# Patient Record
Sex: Female | Born: 1980 | Race: White | Hispanic: No | Marital: Married | Smoking: Never smoker
Health system: Southern US, Community
[De-identification: ages and names within clinical notes are randomized; demographics above are authoritative.]

## PROBLEM LIST (undated history)

## (undated) DIAGNOSIS — O0940 Supervision of pregnancy with grand multiparity, unspecified trimester: Secondary | ICD-10-CM

## (undated) DIAGNOSIS — IMO0001 Reserved for inherently not codable concepts without codable children: Secondary | ICD-10-CM

## (undated) HISTORY — PX: DILATION AND CURETTAGE OF UTERUS: SHX78

## (undated) HISTORY — PX: OVARIAN CYST SURGERY: SHX726

---

## 2006-04-30 ENCOUNTER — Inpatient Hospital Stay (HOSPITAL_COMMUNITY): Admission: AD | Admit: 2006-04-30 | Discharge: 2006-05-01 | Payer: Self-pay | Admitting: Obstetrics and Gynecology

## 2008-01-20 ENCOUNTER — Inpatient Hospital Stay (HOSPITAL_COMMUNITY): Admission: AD | Admit: 2008-01-20 | Discharge: 2008-01-20 | Payer: Self-pay | Admitting: Obstetrics and Gynecology

## 2008-03-22 ENCOUNTER — Inpatient Hospital Stay (HOSPITAL_COMMUNITY): Admission: AD | Admit: 2008-03-22 | Discharge: 2008-03-23 | Payer: Self-pay | Admitting: Obstetrics and Gynecology

## 2008-12-11 ENCOUNTER — Encounter: Admission: RE | Admit: 2008-12-11 | Discharge: 2008-12-11 | Payer: Self-pay | Admitting: Family Medicine

## 2010-04-11 ENCOUNTER — Inpatient Hospital Stay (HOSPITAL_COMMUNITY): Admission: AD | Admit: 2010-04-11 | Discharge: 2010-04-12 | Payer: Self-pay | Admitting: Obstetrics and Gynecology

## 2010-08-26 ENCOUNTER — Encounter: Admission: RE | Admit: 2010-08-26 | Discharge: 2010-08-26 | Payer: Self-pay | Admitting: Family Medicine

## 2010-12-21 LAB — CBC
HCT: 31.8 % — ABNORMAL LOW (ref 36.0–46.0)
Hemoglobin: 12.5 g/dL (ref 12.0–15.0)
MCH: 29.1 pg (ref 26.0–34.0)
MCHC: 33.7 g/dL (ref 30.0–36.0)
MCHC: 34.9 g/dL (ref 30.0–36.0)
MCV: 85.5 fL (ref 78.0–100.0)
Platelets: 149 10*3/uL — ABNORMAL LOW (ref 150–400)
RDW: 16.1 % — ABNORMAL HIGH (ref 11.5–15.5)
RDW: 16.2 % — ABNORMAL HIGH (ref 11.5–15.5)
WBC: 11.1 10*3/uL — ABNORMAL HIGH (ref 4.0–10.5)

## 2010-12-21 LAB — RPR: RPR Ser Ql: NONREACTIVE

## 2011-02-20 NOTE — H&P (Signed)
Jacqueline Tanner, Jacqueline Tanner            ACCOUNT NO.:  [id number]   MEDICAL RECORD NO.:  [id number]          PATIENT TYPE:  INP   LOCATION:  9138                          FACILITY:  [facility]   PHYSICIAN:  Lenoard Aden, M.D.DATE OF BIRTH:  June 19, 1981   DATE OF ADMISSION:  04/30/2006  DATE OF DISCHARGE:                                HISTORY & PHYSICAL   CHIEF COMPLAINT:  Labor.   HISTORY OF PRESENT ILLNESS:  The patient is a 30 year old white female G5,  P2-0-2-2 who presents at [redacted] weeks gestation in labor.  She has a history of  uncomplicated vaginal delivery x3, D and C x2.   ALLERGIES:  PENICILLIN, SHELLFISH, SURGICAL TAPE.   SOCIAL HISTORY:  She is a nonsmoker and nondrinker.  She denies domestic or  physical violence.   PAST MEDICAL HISTORY:  No history of chronic hypertension, diabetes,  migraines, ovarian cancer, depression.   OBSTETRICAL HISTORY:  Her prenatal course was uncomplicated.   PHYSICAL EXAMINATION:  GENERAL APPEARANCE:  She is a well-developed, well-  nourished white female in a moderate amount of discomfort.  HEENT:  Normal.  NECK:  Supple.  LUNGS:  Clear.  HEART:  Regular rate and rhythm.  ABDOMEN:  Soft, gravid, nontender.  PELVIC:  Cervix 5 cm, 100%, minus 1.  EXTREMITIES:  Reveal no cords.  NEUROLOGICAL:  Nonfocal.   IMPRESSION:  Term intrauterine pregnancy in active labor.   PLAN:  Anticipate attempts at vaginal delivery.      Lenoard Aden, M.D.  Electronically Signed     RJT/MEDQ  D:  05/01/2006  T:  05/01/2006  Job:  [id number]

## 2011-02-20 NOTE — Op Note (Signed)
Jacqueline Tanner, ROTTMANN            ACCOUNT NO.:  [id number]   MEDICAL RECORD NO.:  [id number]          PATIENT TYPE:  INP   LOCATION:                                FACILITY:  [facility]   PHYSICIAN:  Lenoard Aden, M.D.DATE OF BIRTH:  08-Sep-1981   DATE OF PROCEDURE:  DATE OF DISCHARGE:  05/01/2006                                 OPERATIVE REPORT   BRIEF DESCRIPTION OF DELIVERY:  The patient is coping well with labor, fetal  heart tones within normal limits, contraction are regular.  The patient is  fully dilated, LOA, and plus 3 station.  She pushes three times after  spontaneous amniotomy of clear fluid for delivery of a full term living  female from occiput anterior position over a second degree midline  laceration.  The fetus received Apgars of 8 and 9.  Cord blood collected.  Placenta delivered spontaneously intact, three vessel cord noted.  No  cervical or vaginal lacerations noted.  Repair of second degree midline  perineal laceration with 3-0 Vicryl repeat without complications.  Estimated  blood loss 500 mL.  No complications.  Mother and baby doing well and  tolerated the procedure without difficulty.      Lenoard Aden, M.D.  Electronically Signed     RJT/MEDQ  D:  05/01/2006  T:  05/01/2006  Job:  [id number]

## 2011-07-02 LAB — CCBB MATERNAL DONOR DRAW

## 2011-07-02 LAB — CBC
Hemoglobin: 14.1
MCHC: 34.9
Platelets: 159
RBC: 4.47
RDW: 13
RDW: 13.4
WBC: 11.5 — ABNORMAL HIGH

## 2011-07-02 LAB — RPR: RPR Ser Ql: NONREACTIVE

## 2011-09-14 LAB — OB RESULTS CONSOLE HIV ANTIBODY (ROUTINE TESTING): HIV: NONREACTIVE

## 2011-09-14 LAB — OB RESULTS CONSOLE ANTIBODY SCREEN: Antibody Screen: NEGATIVE

## 2011-09-14 LAB — OB RESULTS CONSOLE ABO/RH: RH Type: POSITIVE

## 2011-09-14 LAB — OB RESULTS CONSOLE RUBELLA ANTIBODY, IGM: Rubella: IMMUNE

## 2011-10-06 NOTE — L&D Delivery Note (Addendum)
Delivery Note At 7:55 PM a viable female was delivered via Vaginal, Spontaneous Delivery (Presentation: Right Occiput Posterior).  APGAR: 8, 9; weight pending.   Placenta status: Intact, Spontaneous.  Cord: 3 vessels with the following complications: true Knot.  Cord pH: not done  Anesthesia: Epidural  Episiotomy: None Lacerations: 1st degree Suture Repair: 3.0 vicryl rapide Est. Blood Loss (mL): 300  Mom to postpartum.  Baby to mother for kangaroo care  IV and epidural cath maintained for BTL in AM.  PAUL,DANIELA 04/09/2012, 9:08 PM

## 2012-04-03 ENCOUNTER — Inpatient Hospital Stay (HOSPITAL_COMMUNITY)
Admission: AD | Admit: 2012-04-03 | Discharge: 2012-04-04 | Disposition: A | Discharge status: Home / Self Care | Payer: BC Managed Care – PPO | Source: Ambulatory Visit | Attending: Obstetrics and Gynecology | Admitting: Obstetrics and Gynecology

## 2012-04-03 ENCOUNTER — Encounter (HOSPITAL_COMMUNITY): Payer: Self-pay | Admitting: *Deleted

## 2012-04-03 DIAGNOSIS — O479 False labor, unspecified: Secondary | ICD-10-CM | POA: Insufficient documentation

## 2012-04-03 NOTE — MAU Note (Signed)
Pt G9 P5, at 38.3wks having contractions since 1700.  Denies bleeding, leaking or problems with pregnancy.

## 2012-04-04 NOTE — MAU Provider Note (Signed)
  History     CSN: [id number]  Arrival date and time: 04/03/12 2308 Call to provider @0025  Provider here to examine patient @ 0045     Chief Complaint  Patient presents with  . Labor Eval   HPI  Ctx irregular all day - regular x 2 hours and stronger 10-11pm No LOF No bleeding or vaginal discharge  History reviewed. No pertinent past medical history.  Past Surgical History  Procedure Date  . Ovarian cyst surgery   . Dilation and curettage of uterus     Family History  Problem Relation Age of Onset  . Heart disease Mother   . Hypertension Mother     History  Substance Use Topics  . Smoking status: Never Smoker   . Smokeless tobacco: Not on file  . Alcohol Use: No    Allergies:  Allergies  Allergen Reactions  . Penicillins Hives  . Shrimp (Shellfish Allergy) Itching    Prescriptions prior to admission  Medication Sig Dispense Refill  . acetaminophen (TYLENOL) 500 MG tablet Take 600 mg by mouth once.      . diphenhydrAMINE (BENADRYL) 25 MG tablet Take 25 mg by mouth every 6 (six) hours as needed.      . Multiple Vitamin (MULTIVITAMIN) capsule Take 1 capsule by mouth daily.        ROS Physical Exam   Blood pressure 126/77, pulse 94, temperature 98.4 F (36.9 C), temperature source Oral, resp. rate 18, height 5\' 7"  (1.702 m), weight 99.701 kg (219 lb 12.8 oz).  Physical Exam Comfortable, NAD Abdomen soft and non-tender VE: 3 / 80/ Vtx / -1  FHR category 1 - no decels CTX every 10 minutes mild  MAU Course  Procedures  - labor check  Assessment and Plan  38 4/7 braxton-hicks ctx  1) discharge home 2) labor precautions to call  Marlinda Mike 04/04/2012, 12:50 AM

## 2012-04-09 ENCOUNTER — Inpatient Hospital Stay (HOSPITAL_COMMUNITY): Payer: BC Managed Care – PPO | Admitting: Anesthesiology

## 2012-04-09 ENCOUNTER — Inpatient Hospital Stay (HOSPITAL_COMMUNITY)
Admission: AD | Admit: 2012-04-09 | Discharge: 2012-04-11 | DRG: 373 | Disposition: A | Discharge status: Home / Self Care | Payer: BC Managed Care – PPO | Source: Ambulatory Visit | Attending: Obstetrics & Gynecology | Admitting: Obstetrics & Gynecology

## 2012-04-09 ENCOUNTER — Encounter (HOSPITAL_COMMUNITY): Payer: Self-pay | Admitting: *Deleted

## 2012-04-09 ENCOUNTER — Encounter (HOSPITAL_COMMUNITY): Payer: Self-pay | Admitting: Anesthesiology

## 2012-04-09 DIAGNOSIS — IMO0001 Reserved for inherently not codable concepts without codable children: Secondary | ICD-10-CM

## 2012-04-09 HISTORY — DX: Reserved for inherently not codable concepts without codable children: IMO0001

## 2012-04-09 HISTORY — DX: Supervision of pregnancy with grand multiparity, unspecified trimester: O09.40

## 2012-04-09 LAB — RPR: RPR Ser Ql: NONREACTIVE

## 2012-04-09 LAB — CBC
MCH: 26.9 pg (ref 26.0–34.0)
MCHC: 32.5 g/dL (ref 30.0–36.0)
Platelets: 185 10*3/uL (ref 150–400)
RDW: 14 % (ref 11.5–15.5)

## 2012-04-09 LAB — ABO/RH: ABO/RH(D): O POS

## 2012-04-09 LAB — TYPE AND SCREEN
ABO/RH(D): O POS
Antibody Screen: NEGATIVE

## 2012-04-09 MED ORDER — ACETAMINOPHEN 325 MG PO TABS: 650.0000 mg | ORAL_TABLET | ORAL | Status: DC | PRN

## 2012-04-09 MED ORDER — TERBUTALINE SULFATE 1 MG/ML IJ SOLN: 0.2500 mg | Freq: Once | INTRAMUSCULAR | Status: DC | PRN

## 2012-04-09 MED ORDER — LACTATED RINGERS IV SOLN
INTRAVENOUS | Status: DC
  Administered 2012-04-10: 1000 mL via INTRAVENOUS
  Administered 2012-04-10: 06:00:00 via INTRAVENOUS

## 2012-04-09 MED ORDER — SIMETHICONE 80 MG PO CHEW
80.0000 mg | CHEWABLE_TABLET | ORAL | Status: DC | PRN
  Administered 2012-04-10 (×2): 80 mg via ORAL

## 2012-04-09 MED ORDER — LACTATED RINGERS IV SOLN
500.0000 mL | Freq: Once | INTRAVENOUS | Status: AC
  Administered 2012-04-09: 1000 mL via INTRAVENOUS

## 2012-04-09 MED ORDER — DEXTROSE 5 % IV SOLN: INTRAVENOUS | Status: DC

## 2012-04-09 MED ORDER — WITCH HAZEL-GLYCERIN EX PADS
1.0000 "application " | MEDICATED_PAD | CUTANEOUS | Status: DC | PRN
  Administered 2012-04-10: 1 via TOPICAL

## 2012-04-09 MED ORDER — CITRIC ACID-SODIUM CITRATE 334-500 MG/5ML PO SOLN: 30.0000 mL | ORAL | Status: DC | PRN

## 2012-04-09 MED ORDER — FENTANYL 2.5 MCG/ML BUPIVACAINE 1/10 % EPIDURAL INFUSION (WH - ANES)
14.0000 mL/h | INTRAMUSCULAR | Status: DC
Start: 2012-04-09 — End: 2012-04-09
  Administered 2012-04-09: 14 mL/h via EPIDURAL
  Filled 2012-04-09 (×2): qty 60

## 2012-04-09 MED ORDER — OXYCODONE-ACETAMINOPHEN 5-325 MG PO TABS: 1.0000 | ORAL_TABLET | ORAL | Status: DC | PRN

## 2012-04-09 MED ORDER — METOCLOPRAMIDE HCL 10 MG PO TABS
10.0000 mg | ORAL_TABLET | Freq: Once | ORAL | Status: AC
  Administered 2012-04-10: 10 mg via ORAL
  Filled 2012-04-09: qty 1

## 2012-04-09 MED ORDER — FAMOTIDINE 20 MG PO TABS
40.0000 mg | ORAL_TABLET | Freq: Once | ORAL | Status: AC
  Administered 2012-04-10: 40 mg via ORAL
  Filled 2012-04-09: qty 2

## 2012-04-09 MED ORDER — LANOLIN HYDROUS EX OINT: TOPICAL_OINTMENT | CUTANEOUS | Status: DC | PRN

## 2012-04-09 MED ORDER — ONDANSETRON HCL 4 MG/2ML IJ SOLN: 4.0000 mg | Freq: Four times a day (QID) | INTRAMUSCULAR | Status: DC | PRN

## 2012-04-09 MED ORDER — PRENATAL MULTIVITAMIN CH
1.0000 | ORAL_TABLET | Freq: Every day | ORAL | Status: DC
  Administered 2012-04-11: 1 via ORAL
  Filled 2012-04-09 (×2): qty 1

## 2012-04-09 MED ORDER — FENTANYL 2.5 MCG/ML BUPIVACAINE 1/10 % EPIDURAL INFUSION (WH - ANES)
INTRAMUSCULAR | Status: DC | PRN
  Administered 2012-04-09: 14 mL/h via EPIDURAL

## 2012-04-09 MED ORDER — DIPHENHYDRAMINE HCL 50 MG/ML IJ SOLN
12.5000 mg | INTRAMUSCULAR | Status: DC | PRN
Start: 2012-04-09 — End: 2012-04-09

## 2012-04-09 MED ORDER — LIDOCAINE HCL (PF) 1 % IJ SOLN
30.0000 mL | INTRAMUSCULAR | Status: DC | PRN
  Filled 2012-04-09: qty 30

## 2012-04-09 MED ORDER — LACTATED RINGERS IV SOLN
INTRAVENOUS | Status: DC
  Administered 2012-04-09: 13:00:00 via INTRAVENOUS

## 2012-04-09 MED ORDER — OXYTOCIN BOLUS FROM INFUSION
250.0000 mL | Freq: Once | INTRAVENOUS | Status: DC
  Filled 2012-04-09: qty 500

## 2012-04-09 MED ORDER — IBUPROFEN 600 MG PO TABS
600.0000 mg | ORAL_TABLET | Freq: Four times a day (QID) | ORAL | Status: DC
  Administered 2012-04-10 – 2012-04-11 (×5): 600 mg via ORAL
  Filled 2012-04-09 (×5): qty 1

## 2012-04-09 MED ORDER — OXYTOCIN 40 UNITS IN LACTATED RINGERS INFUSION - SIMPLE MED
1.0000 m[IU]/min | INTRAVENOUS | Status: DC
  Administered 2012-04-09: 2 m[IU]/min via INTRAVENOUS

## 2012-04-09 MED ORDER — OXYCODONE-ACETAMINOPHEN 5-325 MG PO TABS
1.0000 | ORAL_TABLET | ORAL | Status: DC | PRN
  Administered 2012-04-10 (×3): 1 via ORAL
  Administered 2012-04-11: 2 via ORAL
  Filled 2012-04-09: qty 1
  Filled 2012-04-09: qty 2
  Filled 2012-04-09 (×2): qty 1

## 2012-04-09 MED ORDER — DIBUCAINE 1 % RE OINT: 1.0000 "application " | TOPICAL_OINTMENT | RECTAL | Status: DC | PRN

## 2012-04-09 MED ORDER — OXYTOCIN 10 UNIT/ML IJ SOLN: 10.0000 [IU] | Freq: Once | INTRAMUSCULAR | Status: DC

## 2012-04-09 MED ORDER — ZOLPIDEM TARTRATE 5 MG PO TABS: 5.0000 mg | ORAL_TABLET | Freq: Every evening | ORAL | Status: DC | PRN

## 2012-04-09 MED ORDER — OXYTOCIN 40 UNITS IN LACTATED RINGERS INFUSION - SIMPLE MED: 62.5000 mL/h | Freq: Once | INTRAVENOUS | Status: DC

## 2012-04-09 MED ORDER — FLEET ENEMA 7-19 GM/118ML RE ENEM: 1.0000 | ENEMA | RECTAL | Status: DC | PRN

## 2012-04-09 MED ORDER — DIPHENHYDRAMINE HCL 25 MG PO CAPS: 25.0000 mg | ORAL_CAPSULE | Freq: Four times a day (QID) | ORAL | Status: DC | PRN

## 2012-04-09 MED ORDER — ONDANSETRON HCL 4 MG PO TABS: 4.0000 mg | ORAL_TABLET | ORAL | Status: DC | PRN

## 2012-04-09 MED ORDER — FLEET ENEMA 7-19 GM/118ML RE ENEM: 1.0000 | ENEMA | Freq: Every day | RECTAL | Status: DC | PRN

## 2012-04-09 MED ORDER — BENZOCAINE-MENTHOL 20-0.5 % EX AERO: 1.0000 "application " | INHALATION_SPRAY | CUTANEOUS | Status: DC | PRN

## 2012-04-09 MED ORDER — OXYTOCIN 40 UNITS IN LACTATED RINGERS INFUSION - SIMPLE MED
INTRAVENOUS | Status: AC
  Administered 2012-04-09: 2 m[IU]/min via INTRAVENOUS
  Filled 2012-04-09: qty 1000

## 2012-04-09 MED ORDER — EPHEDRINE 5 MG/ML INJ: 10.0000 mg | INTRAVENOUS | Status: DC | PRN

## 2012-04-09 MED ORDER — SODIUM BICARBONATE 8.4 % IV SOLN
INTRAVENOUS | Status: DC | PRN
  Administered 2012-04-09: 4 mL via EPIDURAL

## 2012-04-09 MED ORDER — PHENYLEPHRINE 40 MCG/ML (10ML) SYRINGE FOR IV PUSH (FOR BLOOD PRESSURE SUPPORT)
80.0000 ug | PREFILLED_SYRINGE | INTRAVENOUS | Status: DC | PRN
  Filled 2012-04-09: qty 5

## 2012-04-09 MED ORDER — PHENYLEPHRINE 40 MCG/ML (10ML) SYRINGE FOR IV PUSH (FOR BLOOD PRESSURE SUPPORT): 80.0000 ug | PREFILLED_SYRINGE | INTRAVENOUS | Status: DC | PRN

## 2012-04-09 MED ORDER — TETANUS-DIPHTH-ACELL PERTUSSIS 5-2.5-18.5 LF-MCG/0.5 IM SUSP
0.5000 mL | Freq: Once | INTRAMUSCULAR | Status: AC
  Administered 2012-04-10: 0.5 mL via INTRAMUSCULAR
  Filled 2012-04-09: qty 0.5

## 2012-04-09 MED ORDER — LACTATED RINGERS IV SOLN: 500.0000 mL | INTRAVENOUS | Status: DC | PRN

## 2012-04-09 MED ORDER — EPHEDRINE 5 MG/ML INJ
10.0000 mg | INTRAVENOUS | Status: DC | PRN
  Administered 2012-04-09: 10 mg via INTRAVENOUS
  Filled 2012-04-09: qty 4

## 2012-04-09 MED ORDER — SENNOSIDES-DOCUSATE SODIUM 8.6-50 MG PO TABS
2.0000 | ORAL_TABLET | Freq: Every day | ORAL | Status: DC
  Administered 2012-04-10: 2 via ORAL

## 2012-04-09 MED ORDER — BISACODYL 10 MG RE SUPP: 10.0000 mg | Freq: Every day | RECTAL | Status: DC | PRN

## 2012-04-09 MED ORDER — ONDANSETRON HCL 4 MG/2ML IJ SOLN: 4.0000 mg | INTRAMUSCULAR | Status: DC | PRN

## 2012-04-09 MED ORDER — IBUPROFEN 600 MG PO TABS: 600.0000 mg | ORAL_TABLET | Freq: Four times a day (QID) | ORAL | Status: DC | PRN

## 2012-04-09 NOTE — Anesthesia Procedure Notes (Signed)
Epidural Patient location during procedure: OB  Preanesthetic Checklist Completed: patient identified, site marked, surgical consent, pre-op evaluation, timeout performed, IV checked, risks and benefits discussed and monitors and equipment checked  Epidural Patient position: sitting Prep: site prepped and draped and DuraPrep Patient monitoring: continuous pulse ox and blood pressure Approach: midline Injection technique: LOR air  Needle:  Needle type: Tuohy  Needle gauge: 17 G Needle length: 9 cm Needle insertion depth: 5 cm cm Catheter type: closed end flexible Catheter size: 19 Gauge Catheter at skin depth: 11 cm Test dose: negative  Assessment Events: blood not aspirated, injection not painful, no injection resistance, negative IV test and no paresthesia  Additional Notes Dosing of Epidural:  1st dose, through needle ............................................. epi 1:200K + Xylocaine 40 mg  2nd dose, through catheter, after waiting 3 minutes.....epi 1:200K + Xylocaine 40 mg  3rd dose, through catheter after waiting 3 minutes .............................Marcaine   4mg   ( mg Marcaine are expressed as equivilent  cc's medication removed from the 0.1%Bupiv / fentanyl syringe from L&D pump)  ( 2% Xylo charted as a single dose in Epic Meds for ease of charting; actual dosing was fractionated as above, for saftey's sake)  As each dose occurred, patient was free of IV sx; and patient exhibited no evidence of SA injection.  Patient is more comfortable after epidural dosed. Please see RN's note for documentation of vital signs,and FHR which are stable.  Patient reminded not to try to ambulate with numb legs, and that an RN must be present the 1st time she attempts to get up.    

## 2012-04-09 NOTE — Progress Notes (Signed)
Jacqueline Tanner is a 31 y.o. [phone] at [redacted]w[redacted]d by LMP admitted for labor  Subjective: S/P epidural, ctx, infrequent, Pitocin augmentation started. Comfortable.  Objective: Filed Vitals:   04/09/12 1530 04/09/12 1600 04/09/12 1630 04/09/12 1702  BP: 101/49 99/50 99/53  98/56  Pulse: 88 84 81 85  Temp:  98.2 F (36.8 C)    TempSrc:  Oral    Resp: 18 18 18 18   Height:      Weight:      SpO2:              FHT:  FHR: 145 bpm, variability: moderate,  accelerations:  Present,  decelerations:  Absent UC:   Irregular, mild/mod SVE:   Dilation: 5 Effacement (%): 80 Station: -1 Exam by:: Colon Flattery, CNM AROM, cl AF, IUPC placed Labs:   Basename 04/09/12 1240  WBC 10.6*  HGB 11.2*  HCT 34.5*  PLT 185    Assessment / Plan: Protracted active phase  Labor: AROM and Pitocin for augmentation, IUPC placed Preeclampsia:  n/a Fetal Wellbeing:  Category I Pain Control:  Epidural I/D:  GBS neg Anticipated MOD:  NSVD  Jacqueline Tanner 04/09/2012, 5:52 PM

## 2012-04-09 NOTE — H&P (Signed)
Jacqueline Tanner is a 31 y.o. female at [redacted]w[redacted]d presenting for painful ctx since early AM. Q 5 min for past hour. S/P membrane strip in office yesterday, was 4/80/-2. Denies, N/V, LOF / VB. Good fetal movement.  Maternal Medical History:  Reason for admission: Reason for Admission:   nausea  OB History    Grav Para Term Preterm Abortions TAB SAB Ect Mult Living   9 5 5  3  3   5      No past medical history on file. Past Surgical History  Procedure Date  . Ovarian cyst surgery   . Dilation and curettage of uterus    Family History: family history includes Heart disease in her mother and Hypertension in her mother. Social History:  reports that she has never smoked. She does not have any smokeless tobacco history on file. She reports that she does not drink alcohol or use illicit drugs.   Prenatal Transfer Tool  Maternal Diabetes: No Genetic Screening: Declined Maternal Ultrasounds/Referrals: Normal Fetal Ultrasounds or other Referrals:  None Maternal Substance Abuse:  No Significant Maternal Medications:  None Significant Maternal Lab Results:  Lab values include: Group B Strep negative Other Comments:  None  Review of Systems  Eyes: Negative for blurred vision.  Gastrointestinal: Negative for nausea and vomiting.  Neurological: Negative for headaches.      Blood pressure 120/71, pulse 92, temperature 98.3 F (36.8 C), temperature source Oral, resp. rate 18, height 5\' 7"  (1.702 m), weight 98.884 kg (218 lb). Maternal Exam:  Uterine Assessment: Contraction strength is moderate.  Contraction duration is 60 seconds. Contraction frequency is regular.   Abdomen: Patient reports no abdominal tenderness. Fundal height is S=D.   Estimated fetal weight is 8.5.   Fetal presentation: vertex  Introitus: Normal vulva. Pelvis: adequate for delivery.   Proven to 9'1 Cervix: Cervix evaluated by digital exam.   5/90/-2  Fetal Exam Fetal Monitor Review: Mode: ultrasound.   Baseline  rate: 140.  Variability: moderate (6-25 bpm).   Pattern: accelerations present and no decelerations.    Fetal State Assessment: Category I - tracings are normal.     Physical Exam  Constitutional: She is oriented to person, place, and time. She appears well-developed and well-nourished.  HENT:  Head: Normocephalic.  Eyes: Pupils are equal, round, and reactive to light.  Neck: Normal range of motion.  Cardiovascular: Normal rate and regular rhythm.   Respiratory: Effort normal and breath sounds normal.  GI: Bowel sounds are normal.  Genitourinary: Vagina normal.  Musculoskeletal: Normal range of motion. She exhibits no edema.  Neurological: She is alert and oriented to person, place, and time.  Skin: Skin is warm.  Psychiatric: She has a normal mood and affect.    Prenatal labs: ABO, Rh: O/Positive/-- (12/10 0000) Antibody: Negative (12/10 0000) Rubella: Immune (12/10 0000) RPR: Nonreactive (12/10 0000)  HBsAg: Negative (12/10 0000)  HIV: Non-reactive (12/10 0000)  GBS: Negative (06/14 0000)  Genetic screen; declined  Assessment/Plan: IUP at term, latent labor Reassuring fetal / maternal status GBS neg Undesired fertility, desires PPTL and has been counseled in office  Admit to [facility] Routine orders Epidural PRN AROM and Pitocin PRN   PAUL,DANIELA 04/09/2012 01:30 PM    PAUL,DANIELA 04/09/2012, 1:04 PM

## 2012-04-09 NOTE — Anesthesia Preprocedure Evaluation (Signed)

## 2012-04-09 NOTE — Progress Notes (Signed)
Patient ID: Jacqueline Tanner, female   DOB: 1981/04/23, 31 y.o.   MRN: [id number] Patient seen in LDR. CNM service patient. Uncomplicated pregnancy, G6P5, at term, anticipate SVD. Desires permanent sterilization. Was counseled in office.  CREST data on permanent female sterilization incl risk of regret and risk of failure reviewed. Postpartum tubal ligation procedure reviewed, including risk of bleeding, infection, damage to internal organs, risk of ectopic pregnancy in case of failure reviewed, patient voiced understanding.  Plan to schedule tomorrow morning, will keep epidural after birth.  --V.Juliene Pina, MD

## 2012-04-09 NOTE — H&P (Addendum)
Pt reviewed, agree with note and plan. --V.Juliene Pina, MD  MD note- Updated H&P 04/10/12. Healthy 31 yo, G6P6, postpartum here for PPTL. No med/surg hx. PCN allergy. SocHx married, non smoker. VS stable, exam nl, no interval change. A/P reviewed, PPTL desired, consents with informed risks/benefits/ alternative.  --V.Juliene Pina, MD

## 2012-04-10 ENCOUNTER — Encounter (HOSPITAL_COMMUNITY): Payer: Self-pay

## 2012-04-10 ENCOUNTER — Encounter (HOSPITAL_COMMUNITY)
Admission: AD | Disposition: A | Discharge status: Home / Self Care | Payer: Self-pay | Source: Ambulatory Visit | Attending: Obstetrics & Gynecology

## 2012-04-10 ENCOUNTER — Inpatient Hospital Stay (HOSPITAL_COMMUNITY): Payer: BC Managed Care – PPO

## 2012-04-10 ENCOUNTER — Encounter (HOSPITAL_COMMUNITY): Payer: Self-pay | Admitting: *Deleted

## 2012-04-10 HISTORY — PX: TUBAL LIGATION: SHX77

## 2012-04-10 LAB — CBC
HCT: 31.9 % — ABNORMAL LOW (ref 36.0–46.0)
MCH: 27.8 pg (ref 26.0–34.0)
MCHC: 33.2 g/dL (ref 30.0–36.0)
MCV: 83.7 fL (ref 78.0–100.0)
RDW: 14.5 % (ref 11.5–15.5)

## 2012-04-10 LAB — SURGICAL PCR SCREEN: Staphylococcus aureus: NEGATIVE

## 2012-04-10 SURGERY — LIGATION, FALLOPIAN TUBE, POSTPARTUM
Anesthesia: Epidural | Site: Abdomen | Laterality: Bilateral | Wound class: Clean Contaminated

## 2012-04-10 MED ORDER — DEXAMETHASONE SODIUM PHOSPHATE 10 MG/ML IJ SOLN
INTRAMUSCULAR | Status: AC
  Filled 2012-04-10: qty 1

## 2012-04-10 MED ORDER — FENTANYL CITRATE 0.05 MG/ML IJ SOLN
INTRAMUSCULAR | Status: AC
  Filled 2012-04-10: qty 2

## 2012-04-10 MED ORDER — BUPIVACAINE HCL (PF) 0.25 % IJ SOLN
INTRAMUSCULAR | Status: AC
  Filled 2012-04-10: qty 30

## 2012-04-10 MED ORDER — DEXAMETHASONE SODIUM PHOSPHATE 10 MG/ML IJ SOLN
INTRAMUSCULAR | Status: DC | PRN
  Administered 2012-04-10: 10 mg via INTRAVENOUS

## 2012-04-10 MED ORDER — LIDOCAINE-EPINEPHRINE (PF) 2 %-1:200000 IJ SOLN
INTRAMUSCULAR | Status: AC
  Filled 2012-04-10: qty 20

## 2012-04-10 MED ORDER — ONDANSETRON HCL 4 MG/2ML IJ SOLN
INTRAMUSCULAR | Status: DC | PRN
  Administered 2012-04-10: 4 mg via INTRAVENOUS

## 2012-04-10 MED ORDER — PROPOFOL 10 MG/ML IV EMUL
INTRAVENOUS | Status: AC
  Filled 2012-04-10: qty 20

## 2012-04-10 MED ORDER — LACTATED RINGERS IV SOLN
INTRAVENOUS | Status: DC | PRN
  Administered 2012-04-10 (×2): via INTRAVENOUS

## 2012-04-10 MED ORDER — SODIUM BICARBONATE 8.4 % IV SOLN
INTRAVENOUS | Status: DC | PRN
  Administered 2012-04-10: 5 mL via EPIDURAL
  Administered 2012-04-10: 10 mL via EPIDURAL

## 2012-04-10 MED ORDER — CHLOROPROCAINE HCL 3 % IJ SOLN
INTRAMUSCULAR | Status: AC
  Filled 2012-04-10: qty 40

## 2012-04-10 MED ORDER — SODIUM CHLORIDE 0.9 % IR SOLN
Status: DC | PRN
  Administered 2012-04-10: 1000 mL

## 2012-04-10 MED ORDER — HYDROMORPHONE HCL PF 1 MG/ML IJ SOLN
INTRAMUSCULAR | Status: AC
  Filled 2012-04-10: qty 1

## 2012-04-10 MED ORDER — SODIUM BICARBONATE 8.4 % IV SOLN
INTRAVENOUS | Status: AC
  Filled 2012-04-10: qty 50

## 2012-04-10 MED ORDER — BUPIVACAINE HCL (PF) 0.25 % IJ SOLN
INTRAMUSCULAR | Status: DC | PRN
  Administered 2012-04-10: 10 mL

## 2012-04-10 MED ORDER — PROPOFOL 10 MG/ML IV BOLUS
INTRAVENOUS | Status: DC | PRN
  Administered 2012-04-10: 5 mg via INTRAVENOUS
  Administered 2012-04-10: 10 mg via INTRAVENOUS
  Administered 2012-04-10: 5 mg via INTRAVENOUS
  Administered 2012-04-10: 10 mg via INTRAVENOUS

## 2012-04-10 MED ORDER — MIDAZOLAM HCL 5 MG/5ML IJ SOLN
INTRAMUSCULAR | Status: DC | PRN
  Administered 2012-04-10 (×2): 1 mg via INTRAVENOUS

## 2012-04-10 MED ORDER — LIDOCAINE HCL (CARDIAC) 20 MG/ML IV SOLN
INTRAVENOUS | Status: AC
  Filled 2012-04-10: qty 5

## 2012-04-10 MED ORDER — MIDAZOLAM HCL 2 MG/2ML IJ SOLN
INTRAMUSCULAR | Status: AC
  Filled 2012-04-10: qty 2

## 2012-04-10 MED ORDER — HYDROMORPHONE HCL PF 1 MG/ML IJ SOLN
0.2500 mg | INTRAMUSCULAR | Status: DC | PRN
  Administered 2012-04-10 (×2): 0.5 mg via INTRAVENOUS

## 2012-04-10 MED ORDER — SODIUM BICARBONATE 8.4 % IV SOLN
INTRAVENOUS | Status: DC | PRN
  Administered 2012-04-10: 5 mL via EPIDURAL
  Administered 2012-04-10: 3 mL via EPIDURAL
  Administered 2012-04-10: 5 mL via EPIDURAL

## 2012-04-10 MED ORDER — FENTANYL CITRATE 0.05 MG/ML IJ SOLN
INTRAMUSCULAR | Status: DC | PRN
  Administered 2012-04-10 (×2): 50 ug via INTRAVENOUS

## 2012-04-10 MED ORDER — ONDANSETRON HCL 4 MG/2ML IJ SOLN
INTRAMUSCULAR | Status: AC
  Filled 2012-04-10: qty 2

## 2012-04-10 SURGICAL SUPPLY — 29 items
ADH SKN CLS LQ APL DERMABOND (GAUZE/BANDAGES/DRESSINGS) ×1
CHLORAPREP W/TINT 26ML (MISCELLANEOUS) ×2 IMPLANT
CLOTH BEACON ORANGE TIMEOUT ST (SAFETY) ×2 IMPLANT
CONTAINER PREFILL 10% NBF 15ML (MISCELLANEOUS) ×4 IMPLANT
DERMABOND ADHESIVE PROPEN (GAUZE/BANDAGES/DRESSINGS) ×1
DERMABOND ADVANCED .7 DNX6 (GAUZE/BANDAGES/DRESSINGS) IMPLANT
ELECT REM PT RETURN 9FT ADLT (ELECTROSURGICAL) ×2
ELECTRODE REM PT RTRN 9FT ADLT (ELECTROSURGICAL) ×1 IMPLANT
GLOVE BIO SURGEON STRL SZ7 (GLOVE) ×2 IMPLANT
GLOVE BIOGEL PI IND STRL 7.0 (GLOVE) ×2 IMPLANT
GLOVE BIOGEL PI INDICATOR 7.0 (GLOVE) ×2
GLOVE SKINSENSE NS SZ6.5 (GLOVE) ×2
GLOVE SKINSENSE STRL SZ6.5 (GLOVE) IMPLANT
GOWN PREVENTION PLUS LG XLONG (DISPOSABLE) ×4 IMPLANT
NDL HYPO 25X1 1.5 SAFETY (NEEDLE) ×1 IMPLANT
NEEDLE HYPO 25X1 1.5 SAFETY (NEEDLE) ×2 IMPLANT
NS IRRIG 1000ML POUR BTL (IV SOLUTION) ×2 IMPLANT
PACK ABDOMINAL MINOR (CUSTOM PROCEDURE TRAY) ×2 IMPLANT
PENCIL BUTTON HOLSTER BLD 10FT (ELECTRODE) ×2 IMPLANT
SPONGE LAP 4X18 X RAY DECT (DISPOSABLE) ×1 IMPLANT
STRIP CLOSURE SKIN 1/4X3 (GAUZE/BANDAGES/DRESSINGS) ×2 IMPLANT
SUT PLAIN 0 NONE (SUTURE) ×2 IMPLANT
SUT VIC AB 0 CT1 27 (SUTURE) ×2
SUT VIC AB 0 CT1 27XBRD ANBCTR (SUTURE) ×1 IMPLANT
SUT VICRYL 4-0 PS2 18IN ABS (SUTURE) ×2 IMPLANT
SYR CONTROL 10ML LL (SYRINGE) ×2 IMPLANT
TOWEL OR 17X24 6PK STRL BLUE (TOWEL DISPOSABLE) ×4 IMPLANT
TRAY FOLEY CATH 14FR (SET/KITS/TRAYS/PACK) ×2 IMPLANT
WATER STERILE IRR 1000ML POUR (IV SOLUTION) ×1 IMPLANT

## 2012-04-10 NOTE — Op Note (Signed)
Preoperative diagnosis: Multiparity, permanent sterilization desired Postoperative diagnosis: Same Procedure: Postpartum bilateral tubal ligation by Modified Pomeroy method Surgeon: Dr Shea Evans, MD Assistants: Durwin Nora Anesthesia: Epidural  IV fluids: LR 1100 cc  EBL: minimal 10 cc Urine (foley): clear 100 cc  Complications: none Disposition: PACU and home Specimens: Cut segments of both fallopian tubes   Procedure Patient is a 31 yo, G6 P6, postpartum day 1 who desired permanent sterilization via tubal ligation. All options of contraception and sterilization were reviewed including IUDs, Essure, as well as vasectomy. Patient declined other options. Risk and complications of surgery including infection, bleeding, damage to internal organs, other complications including pneumonia, VTE were reviewed. Also discussed irreversibility as well as failure and risk of ectopic pregnancy. Patient voiced understanding. Informed written consent was obtained and patient was brought to the operating room with IV running. Timeout was carried out.  Patient was in supine position and epidural anesthesia was noted to be adequate. Uterus fundus palpated at the umbilicus. Patient was prepped and draped in standard fashion. Foley placed by circulating RN.  A  1 cm gently curved transverse incision was made in the lower fold of the umbilicus after injecting 3 cc 0.25% Marcaine. Incision was carried down to the fascia, umbilical hernia noted. Hernia sac was empty, peritoneal entry made through a clear area. Peritoneal entry was confirmed. Uterine fundus palpated. Left fallopian tube was grasped with Tanja Port and traced to fimbria. Modified Pomeroy method used for tubal ligation. Mid section of tube grasped and loop created with 2 Plain Gut ties. Loop was excised, hemostasis excellent, ties noted to be secured and was returned to the peritoneal cavity, cut segment sent to pathology. Now the right fallopian tube  was grasped with Tanja Port and traced to fimbria. Modified Pomeroy method used for tubal ligation. Mid section of tube grasped and loop created with 2 Plain Gut ties. Loop was excised, hemostasis excellent, ties noted to be secured and was returned to the peritoneal cavity, cut segment sent to pathology.  No bleeding noted. Peritoneal edges including hernia sac and the rectus fascia were grasped and sutured in one layer using 0-Vicryl. Skin approximated with 3-0 Vicryl in subcuticular fashion. Remaining 7 cc Marcaine injected at the incision site. Dermabond was applied at the incision. No complications. All counts were correct x2. Patient was brought out to the recovery room in stable condition.   --V.Juliene Pina, MD

## 2012-04-10 NOTE — Transfer of Care (Signed)
Immediate Anesthesia Transfer of Care Note  Patient: Jacqueline Tanner  Procedure(s) Performed: Procedure(s) (LRB): POST PARTUM TUBAL LIGATION (Bilateral)  Patient Location: PACU  Anesthesia Type: Epidural  Level of Consciousness: awake, alert  and oriented  Airway & Oxygen Therapy: Patient Spontanous Breathing  Post-op Assessment: Report given to PACU RN and Post -op Vital signs reviewed and stable  Post vital signs: Reviewed and stable  Complications: No apparent anesthesia complications

## 2012-04-10 NOTE — Anesthesia Postprocedure Evaluation (Signed)
  Anesthesia Post-op Note  Patient: Jacqueline Tanner  Procedure(s) Performed: Procedure(s) (LRB): POST PARTUM TUBAL LIGATION (Bilateral)  Patient Location: PACU  Anesthesia Type: Epidural  Level of Consciousness: awake, alert  and oriented  Airway and Oxygen Therapy: Patient Spontanous Breathing  Post-op Pain: mild  Post-op Assessment: Patient's Cardiovascular Status Stable, Respiratory Function Stable, Patent Airway, No signs of Nausea or vomiting and Pain level controlled  Post-op Vital Signs: stable  Complications: No apparent anesthesia complications

## 2012-04-10 NOTE — Anesthesia Postprocedure Evaluation (Signed)
  Anesthesia Post-op Note  Patient: Jacqueline Tanner  Procedure(s) Performed: Procedure(s) (LRB): POST PARTUM TUBAL LIGATION (Bilateral)  Patient Location: PACU and Mother/Baby  Anesthesia Type: Epidural  Level of Consciousness: awake, alert  and oriented  Airway and Oxygen Therapy: Patient Spontanous Breathing    Post-op Assessment: Patient's Cardiovascular Status Stable and Respiratory Function Stable  Post-op Vital Signs: stable  Complications: No apparent anesthesia complications

## 2012-04-10 NOTE — Anesthesia Preprocedure Evaluation (Addendum)

## 2012-04-10 NOTE — Anesthesia Postprocedure Evaluation (Signed)
  Anesthesia Post-op Note  Patient: Jacqueline Tanner  Procedure(s) Performed: Procedure(s) (LRB): POST PARTUM TUBAL LIGATION (Bilateral)  Patient is awake, responsive, moving her legs, and has signs of resolution of her numbness. Pain and nausea are reasonably well controlled. Vital signs are stable and clinically acceptable. Oxygen saturation is clinically acceptable. There are no apparent anesthetic complications at this time. Patient is ready for discharge.

## 2012-04-10 NOTE — Addendum Note (Signed)
Addendum  created 04/10/12 2053 by Len Blalock, CRNA   Modules edited:Notes Section

## 2012-04-11 ENCOUNTER — Encounter (HOSPITAL_COMMUNITY): Payer: Self-pay | Admitting: Obstetrics & Gynecology

## 2012-04-11 MED ORDER — OXYCODONE-ACETAMINOPHEN 5-325 MG PO TABS: 1.0000 | ORAL_TABLET | ORAL | Status: AC | PRN

## 2012-04-11 MED ORDER — IBUPROFEN 600 MG PO TABS: 600.0000 mg | ORAL_TABLET | Freq: Four times a day (QID) | ORAL | Status: AC

## 2012-04-11 NOTE — Progress Notes (Signed)
Post Partum Day #2            Information for the patient's newborn:  Shayma, Pfefferle Girl Dezaree [[id number]]  female   Feeding: breast  Subjective: No HA, SOB, CP, F/C, breast symptoms. Pain controlled w/ Motrin and occasional Percocet. Normal vaginal bleeding, no clots.    S/P PPTL yesterday.   Objective:  Temp:  [97.6 F (36.4 C)-98.5 F (36.9 C)] 97.6 F (36.4 C) (07/08 0330) Pulse Rate:  [61-75] 66  (07/08 0330) Resp:  [15-21] 18  (07/08 0330) BP: (97-127)/(63-76) 106/63 mmHg (07/08 0330) SpO2:  [93 %-97 %] 96 % (07/08 0330)   Intake/Output Summary (Last 24 hours) at 04/11/12 0943 Last data filed at 04/10/12 1015  Gross per 24 hour  Intake    400 ml  Output    400 ml  Net      0 ml       Basename 04/10/12 0544 04/09/12 1240  WBC 11.7* 10.6*  HGB 10.6* 11.2*  HCT 31.9* 34.5*  PLT 162 185    Blood type: --/--/O POS, O POS (07/06 1240) Rubella: Immune (12/10 0000)    Physical Exam:  General: alert, cooperative and no distress Uterine Fundus: firm Abd: umbilical incision w/o redness/drainage, + dermabond Lochia: appropriate Perineum: repair intact, edema none DVT Evaluation: No evidence of DVT seen on physical exam. No significant calf/ankle edema.    Assessment/Plan: PPD # 2 / 31 y.o., [id number] S/P: NVP and post-op day 1, s/p PPTL   Principal Problem:  *Postpartum care following vaginal delivery (7/6, F)    normal postpartum exam   normal post-operative exam  Continue current postpartum care  D/C home   LOS: 2 days   Shirlee Whitmire, CNM, MSN 04/11/2012, 9:43 AM

## 2012-04-11 NOTE — Discharge Summary (Signed)
Obstetric Discharge Summary Reason for Admission: onset of labor Prenatal Procedures: ultrasound Intrapartum Procedures: spontaneous vaginal delivery and epidural Postpartum Procedures: P.P. tubal ligation and TDaP vaccine Complications-Operative and Postpartum: 1st degree perineal laceration Hemoglobin  Date Value Range Status  04/10/2012 10.6* 12.0 - 15.0 g/dL Final     HCT  Date Value Range Status  04/10/2012 31.9* 36.0 - 46.0 % Final    Physical Exam:  General: alert, cooperative and no distress Lochia: appropriate Uterine Fundus: firm Incision: healing well, no significant drainage, no dehiscence, no significant erythema DVT Evaluation: No evidence of DVT seen on physical exam. No significant calf/ankle edema.  Discharge Diagnoses: Term Pregnancy-delivered and bilateral tubal ligation  Discharge Information: Date: 04/11/2012 Activity: pelvic rest Diet: routine Medications: PNV, Ibuprofen and Percocet Condition: stable Instructions: refer to practice specific booklet Discharge to: home Follow-up Information    Follow up with Marlinda Mike, CNM. Schedule an appointment as soon as possible for a visit in 6 weeks.   Contact information:   [address] [place] [address] [phone]          Newborn Data: Live born female "Natalia Leatherwood" Birth Weight: 8 lb 12 oz (3970 g) APGAR: 8, 9  Home with mother.  Jacqueline Tanner 04/11/2012, 9:48 AM

## 2012-04-13 ENCOUNTER — Inpatient Hospital Stay (HOSPITAL_COMMUNITY): Payer: BC Managed Care – PPO

## 2012-04-14 NOTE — Discharge Summary (Signed)
Agree with note and plan.

## 2014-08-06 ENCOUNTER — Encounter (HOSPITAL_COMMUNITY): Payer: Self-pay | Admitting: Obstetrics & Gynecology

## 2014-10-25 ENCOUNTER — Emergency Department (HOSPITAL_COMMUNITY): Payer: BLUE CROSS/BLUE SHIELD

## 2014-10-25 ENCOUNTER — Encounter (HOSPITAL_COMMUNITY): Payer: Self-pay | Admitting: Emergency Medicine

## 2014-10-25 ENCOUNTER — Emergency Department (HOSPITAL_COMMUNITY)
Admission: EM | Admit: 2014-10-25 | Discharge: 2014-10-25 | Disposition: A | Discharge status: Home / Self Care | Payer: BLUE CROSS/BLUE SHIELD | Attending: Emergency Medicine | Admitting: Emergency Medicine

## 2014-10-25 DIAGNOSIS — Z880 Allergy status to penicillin: Secondary | ICD-10-CM | POA: Diagnosis not present

## 2014-10-25 DIAGNOSIS — Y9289 Other specified places as the place of occurrence of the external cause: Secondary | ICD-10-CM | POA: Diagnosis not present

## 2014-10-25 DIAGNOSIS — X58XXXA Exposure to other specified factors, initial encounter: Secondary | ICD-10-CM | POA: Diagnosis not present

## 2014-10-25 DIAGNOSIS — Y998 Other external cause status: Secondary | ICD-10-CM | POA: Insufficient documentation

## 2014-10-25 DIAGNOSIS — Z79899 Other long term (current) drug therapy: Secondary | ICD-10-CM | POA: Diagnosis not present

## 2014-10-25 DIAGNOSIS — S93401A Sprain of unspecified ligament of right ankle, initial encounter: Secondary | ICD-10-CM | POA: Insufficient documentation

## 2014-10-25 DIAGNOSIS — S99911A Unspecified injury of right ankle, initial encounter: Secondary | ICD-10-CM

## 2014-10-25 DIAGNOSIS — S99921A Unspecified injury of right foot, initial encounter: Secondary | ICD-10-CM | POA: Diagnosis not present

## 2014-10-25 DIAGNOSIS — Y9344 Activity, trampolining: Secondary | ICD-10-CM | POA: Insufficient documentation

## 2014-10-25 MED ORDER — IBUPROFEN 800 MG PO TABS: 800.0000 mg | ORAL_TABLET | Freq: Three times a day (TID) | ORAL | Status: DC

## 2014-10-25 NOTE — ED Provider Notes (Signed)
CSN: [id number]     Arrival date & time 10/25/14  2041 History   None   This chart was scribed for non-physician practitioner, Elpidio AnisShari Edmundo Tedesco, working with Gwyneth SproutWhitney Plunkett, MD by Marica OtterNusrat Rahman, ED Scribe. This patient was seen in room WTR9/WTR9 and the patient's care was started at 9:13 PM.  Chief Complaint  Patient presents with  . Foot Injury   The history is provided by the patient. No language interpreter was used.   PCP: No primary care provider on file. HPI Comments: Jacqueline Tanner is a 34 y.o. female, with medical Hx noted below, who presents to the Emergency Department complaining of sudden onset, traumatic, constant right foot pain onset approximately 1.5 hours ago after jumping on a trampoline and feeling a "pop" in the foot. Pt reports it is painful to ambulate. Pt rates her pain a 6 out of 10.   Past Medical History  Diagnosis Date  . Active labor 04/09/2012  . Grand multipara in labor 04/09/2012  . Postpartum care following vaginal delivery (6/6, F) 04/09/2012   Past Surgical History  Procedure Laterality Date  . Ovarian cyst surgery    . Dilation and curettage of uterus    . Tubal ligation  04/10/2012    Procedure: POST PARTUM TUBAL LIGATION;  Surgeon: Robley FriesVaishali R Mody, MD;  Location: [facility];  Service: Gynecology;  Laterality: Bilateral;  Bilateral post partum tubal ligation   Family History  Problem Relation Age of Onset  . Heart disease Mother   . Hypertension Mother    History  Substance Use Topics  . Smoking status: Never Smoker   . Smokeless tobacco: Not on file  . Alcohol Use: No   OB History    Gravida Para Term Preterm AB TAB SAB Ectopic Multiple Living   9 6 6  3  3   6      Review of Systems  Constitutional: Negative for fever and chills.  Musculoskeletal:       Right foot pain   Psychiatric/Behavioral: Negative for confusion.      Allergies  Penicillins and Shrimp  Home Medications   Prior to Admission medications   Medication Sig Start Date  End Date Taking? Authorizing Provider  diphenhydrAMINE (BENADRYL) 25 MG tablet Take 25 mg by mouth every 6 (six) hours as needed. For cold symptoms    Historical Provider, MD  Multiple Vitamin (MULTIVITAMIN) capsule Take 1 capsule by mouth daily.    Historical Provider, MD   Triage Vitals: BP 120/77 mmHg  Pulse 79  Temp(Src) 98 F (36.7 C) (Oral)  Resp 20  Ht 5\' 7"  (1.702 m)  Wt 180 lb (81.647 kg)  BMI 28.19 kg/m2  SpO2 100%  LMP 09/30/2014 (Approximate) Physical Exam  Constitutional: She is oriented to person, place, and time. She appears well-developed and well-nourished. No distress.  HENT:  Head: Normocephalic and atraumatic.  Eyes: Conjunctivae and EOM are normal.  Cardiovascular: Normal rate.   Pulmonary/Chest: Effort normal. No respiratory distress.  Musculoskeletal: Normal range of motion.  Right foot has lateral, proximal forefoot swelling with tenderness. No bony deformities, ankle joint stable. FROM all toes. Achilles tendon intact.   Neurological: She is alert and oriented to person, place, and time.  Skin: Skin is warm and dry.  Psychiatric: She has a normal mood and affect. Her behavior is normal.  Nursing note and vitals reviewed.   ED Course  Procedures (including critical care time) DIAGNOSTIC STUDIES: Oxygen Saturation is 100% on RA, nl by my interpretation.  COORDINATION OF CARE: 9:15 PM-Discussed treatment plan which includes imaging with pt at bedside and pt agreed to plan. Pt declined offer for meds to alleviate pain.   Labs Review Labs Reviewed - No data to display  Imaging Review Dg Ankle Complete Right  10/25/2014   CLINICAL DATA:  Jumping on a trampoline and injured right foot/ ankle. Lateral pain.  EXAM: RIGHT ANKLE - COMPLETE 3+ VIEW  COMPARISON:  None.  FINDINGS: There is no evidence of fracture, dislocation, or joint effusion. There is no evidence of arthropathy or other focal bone abnormality. Soft tissues are unremarkable.  IMPRESSION:  Negative.   Electronically Signed   By: Elige Ko   On: 10/25/2014 21:40   Dg Foot Complete Right  10/25/2014   CLINICAL DATA:  Pt states she was jumping on trampoline at a trampoline park today and injured right foot/ankle. Pain mainly to lateral right foot and ankle.  EXAM: RIGHT FOOT COMPLETE - 3+ VIEW  COMPARISON:  None.  FINDINGS: There is no evidence of fracture or dislocation. There is no evidence of arthropathy or other focal bone abnormality. Soft tissues are unremarkable.  IMPRESSION: Negative.   Electronically Signed   By: Amie Portland M.D.   On: 10/25/2014 21:38     EKG Interpretation None      MDM   Final diagnoses:  None    1. Right foot strain  Negative imaging for bony injury. Will treat as soft tissue injury with supportive care.   I personally performed the services described in this documentation, which was scribed in my presence. The recorded information has been reviewed and is accurate.     Arnoldo Hooker, PA-C 11/07/14 2030  Gwyneth Sprout, MD 11/10/14 0002

## 2014-10-25 NOTE — ED Notes (Signed)
Pt states she was jumping on a trampoline and felt a pop in her right foot  Pt is c/o pain in her foot  Pt states it hurts to walk on it

## 2014-10-25 NOTE — Discharge Instructions (Signed)
Ankle Sprain °An ankle sprain is an injury to the strong, fibrous tissues (ligaments) that hold the bones of your ankle joint together.  °CAUSES °An ankle sprain is usually caused by a fall or by twisting your ankle. Ankle sprains most commonly occur when you step on the outer edge of your foot, and your ankle turns inward. People who participate in sports are more prone to these types of injuries.  °SYMPTOMS  °· Pain in your ankle. The pain may be present at rest or only when you are trying to stand or walk. °· Swelling. °· Bruising. Bruising may develop immediately or within 1 to 2 days after your injury. °· Difficulty standing or walking, particularly when turning corners or changing directions. °DIAGNOSIS  °Your caregiver will ask you details about your injury and perform a physical exam of your ankle to determine if you have an ankle sprain. During the physical exam, your caregiver will press on and apply pressure to specific areas of your foot and ankle. Your caregiver will try to move your ankle in certain ways. An X-ray exam may be done to be sure a bone was not broken or a ligament did not separate from one of the bones in your ankle (avulsion fracture).  °TREATMENT  °Certain types of braces can help stabilize your ankle. Your caregiver can make a recommendation for this. Your caregiver may recommend the use of medicine for pain. If your sprain is severe, your caregiver may refer you to a surgeon who helps to restore function to parts of your skeletal system (orthopedist) or a physical therapist. °HOME CARE INSTRUCTIONS  °· Apply ice to your injury for 1-2 days or as directed by your caregiver. Applying ice helps to reduce inflammation and pain. °· Put ice in a plastic bag. °· Place a towel between your skin and the bag. °· Leave the ice on for 15-20 minutes at a time, every 2 hours while you are awake. °· Only take over-the-counter or prescription medicines for pain, discomfort, or fever as directed by  your caregiver. °· Elevate your injured ankle above the level of your heart as much as possible for 2-3 days. °· If your caregiver recommends crutches, use them as instructed. Gradually put weight on the affected ankle. Continue to use crutches or a cane until you can walk without feeling pain in your ankle. °· If you have a plaster splint, wear the splint as directed by your caregiver. Do not rest it on anything harder than a pillow for the first 24 hours. Do not put weight on it. Do not get it wet. You may take it off to take a shower or bath. °· You may have been given an elastic bandage to wear around your ankle to provide support. If the elastic bandage is too tight (you have numbness or tingling in your foot or your foot becomes cold and blue), adjust the bandage to make it comfortable. °· If you have an air splint, you may blow more air into it or let air out to make it more comfortable. You may take your splint off at night and before taking a shower or bath. Wiggle your toes in the splint several times per day to decrease swelling. °SEEK MEDICAL CARE IF:  °· You have rapidly increasing bruising or swelling. °· Your toes feel extremely cold or you lose feeling in your foot. °· Your pain is not relieved with medicine. °SEEK IMMEDIATE MEDICAL CARE IF: °· Your toes are numb or blue. °·   You have severe pain that is increasing. °MAKE SURE YOU:  °· Understand these instructions. °· Will watch your condition. °· Will get help right away if you are not doing well or get worse. °Document Released: 09/21/2005 Document Revised: 06/15/2012 Document Reviewed: 10/03/2011 °ExitCare® Patient Information ©2015 [employer]. This information is not intended to replace advice given to you by your health care provider. Make sure you discuss any questions you have with your health care provider. °Cryotherapy °Cryotherapy means treatment with cold. Ice or gel packs can be used to reduce both pain and swelling. Ice is the most  helpful within the first 24 to 48 hours after an injury or flare-up from overusing a muscle or joint. Sprains, strains, spasms, burning pain, shooting pain, and aches can all be eased with ice. Ice can also be used when recovering from surgery. Ice is effective, has very few side effects, and is safe for most people to use. °PRECAUTIONS  °Ice is not a safe treatment option for people with: °· Raynaud phenomenon. This is a condition affecting small blood vessels in the extremities. Exposure to cold may cause your problems to return. °· Cold hypersensitivity. There are many forms of cold hypersensitivity, including: °¨ Cold urticaria. Red, itchy hives appear on the skin when the tissues begin to warm after being iced. °¨ Cold erythema. This is a red, itchy rash caused by exposure to cold. °¨ Cold hemoglobinuria. Red blood cells break down when the tissues begin to warm after being iced. The hemoglobin that carry oxygen are passed into the urine because they cannot combine with blood proteins fast enough. °· Numbness or altered sensitivity in the area being iced. °If you have any of the following conditions, do not use ice until you have discussed cryotherapy with your caregiver: °· Heart conditions, such as arrhythmia, angina, or chronic heart disease. °· High blood pressure. °· Healing wounds or open skin in the area being iced. °· Current infections. °· Rheumatoid arthritis. °· Poor circulation. °· Diabetes. °Ice slows the blood flow in the region it is applied. This is beneficial when trying to stop inflamed tissues from spreading irritating chemicals to surrounding tissues. However, if you expose your skin to cold temperatures for too long or without the proper protection, you can damage your skin or nerves. Watch for signs of skin damage due to cold. °HOME CARE INSTRUCTIONS °Follow these tips to use ice and cold packs safely. °· Place a dry or damp towel between the ice and skin. A damp towel will cool the skin  more quickly, so you may need to shorten the time that the ice is used. °· For a more rapid response, add gentle compression to the ice. °· Ice for no more than 10 to 20 minutes at a time. The bonier the area you are icing, the less time it will take to get the benefits of ice. °· Check your skin after 5 minutes to make sure there are no signs of a poor response to cold or skin damage. °· Rest 20 minutes or more between uses. °· Once your skin is numb, you can end your treatment. You can test numbness by very lightly touching your skin. The touch should be so light that you do not see the skin dimple from the pressure of your fingertip. When using ice, most people will feel these normal sensations in this order: cold, burning, aching, and numbness. °· Do not use ice on someone who cannot communicate their responses to pain,   such as small children or people with dementia. °HOW TO MAKE AN ICE PACK °Ice packs are the most common way to use ice therapy. Other methods include ice massage, ice baths, and cryosprays. Muscle creams that cause a cold, tingly feeling do not offer the same benefits that ice offers and should not be used as a substitute unless recommended by your caregiver. °To make an ice pack, do one of the following: °· Place crushed ice or a bag of frozen vegetables in a sealable plastic bag. Squeeze out the excess air. Place this bag inside another plastic bag. Slide the bag into a pillowcase or place a damp towel between your skin and the bag. °· Mix 3 parts water with 1 part rubbing alcohol. Freeze the mixture in a sealable plastic bag. When you remove the mixture from the freezer, it will be slushy. Squeeze out the excess air. Place this bag inside another plastic bag. Slide the bag into a pillowcase or place a damp towel between your skin and the bag. °SEEK MEDICAL CARE IF: °· You develop white spots on your skin. This may give the skin a blotchy (mottled) appearance. °· Your skin turns blue or  pale. °· Your skin becomes waxy or hard. °· Your swelling gets worse. °MAKE SURE YOU:  °· Understand these instructions. °· Will watch your condition. °· Will get help right away if you are not doing well or get worse. °Document Released: 05/18/2011 Document Revised: 02/05/2014 Document Reviewed: 05/18/2011 °ExitCare® Patient Information ©2015 [employer]. This information is not intended to replace advice given to you by your health care provider. Make sure you discuss any questions you have with your health care provider. ° °

## 2016-02-21 ENCOUNTER — Encounter (HOSPITAL_COMMUNITY): Payer: Self-pay | Admitting: Emergency Medicine

## 2016-02-21 ENCOUNTER — Emergency Department (HOSPITAL_COMMUNITY)
Admission: EM | Admit: 2016-02-21 | Discharge: 2016-02-21 | Disposition: A | Discharge status: Home / Self Care | Payer: BLUE CROSS/BLUE SHIELD | Attending: Emergency Medicine | Admitting: Emergency Medicine

## 2016-02-21 ENCOUNTER — Emergency Department (HOSPITAL_COMMUNITY): Payer: BLUE CROSS/BLUE SHIELD

## 2016-02-21 DIAGNOSIS — Z791 Long term (current) use of non-steroidal anti-inflammatories (NSAID): Secondary | ICD-10-CM | POA: Diagnosis not present

## 2016-02-21 DIAGNOSIS — S0990XA Unspecified injury of head, initial encounter: Secondary | ICD-10-CM | POA: Diagnosis present

## 2016-02-21 DIAGNOSIS — W2107XA Struck by softball, initial encounter: Secondary | ICD-10-CM | POA: Diagnosis not present

## 2016-02-21 DIAGNOSIS — Y9364 Activity, baseball: Secondary | ICD-10-CM | POA: Diagnosis not present

## 2016-02-21 DIAGNOSIS — Y999 Unspecified external cause status: Secondary | ICD-10-CM | POA: Diagnosis not present

## 2016-02-21 DIAGNOSIS — Y929 Unspecified place or not applicable: Secondary | ICD-10-CM | POA: Diagnosis not present

## 2016-02-21 DIAGNOSIS — Z79899 Other long term (current) drug therapy: Secondary | ICD-10-CM | POA: Insufficient documentation

## 2016-02-21 MED ORDER — KETOROLAC TROMETHAMINE 30 MG/ML IJ SOLN
30.0000 mg | Freq: Once | INTRAMUSCULAR | Status: AC
  Administered 2016-02-21: 30 mg via INTRAMUSCULAR
  Filled 2016-02-21: qty 1

## 2016-02-21 MED ORDER — ONDANSETRON 8 MG PO TBDP: 8.0000 mg | ORAL_TABLET | Freq: Once | ORAL | Status: DC

## 2016-02-21 MED ORDER — DEXAMETHASONE 4 MG PO TABS
10.0000 mg | ORAL_TABLET | Freq: Once | ORAL | Status: AC
  Administered 2016-02-21: 10 mg via ORAL
  Filled 2016-02-21: qty 2

## 2016-02-21 MED ORDER — ONDANSETRON 4 MG PO TBDP: 4.0000 mg | ORAL_TABLET | Freq: Three times a day (TID) | ORAL | Status: AC | PRN

## 2016-02-21 NOTE — ED Notes (Addendum)
Patient staTES that she was hit in the head on Tuesday with a softball.  Patient c/o headaches, flashes of light, and nausea since.  Patient states that she is sensitive to light.  Patient states that those are symptoms she gets with her migraines.  Patient doesn't take any medications for her migraines.  Patient states that she took ibuprofen on Tuesday when she got hit.  Patient denies any LOC, or dizziness. Patient doesn't take any blood thinners.

## 2016-02-21 NOTE — Discharge Instructions (Signed)
As discussed, your evaluation today has been largely reassuring.  But, it is important that you monitor your condition carefully, and do not hesitate to return to the ED if you develop new, or concerning changes in your condition. ° °Otherwise, please follow-up with your physician for appropriate ongoing care. ° °Concussion, Adult °A concussion, or closed-head injury, is a brain injury caused by a direct blow to the head or by a quick and sudden movement (jolt) of the head or neck. Concussions are usually not life-threatening. Even so, the effects of a concussion can be serious. If you have had a concussion before, you are more likely to experience concussion-like symptoms after a direct blow to the head.  °CAUSES °· Direct blow to the head, such as from running into another player during a soccer game, being hit in a fight, or hitting your head on a hard surface. °· A jolt of the head or neck that causes the brain to move back and forth inside the skull, such as in a car crash. °SIGNS AND SYMPTOMS °The signs of a concussion can be hard to notice. Early on, they may be missed by you, family members, and health care providers. You may look fine but act or feel differently. °Symptoms are usually temporary, but they may last for days, weeks, or even longer. Some symptoms may appear right away while others may not show up for hours or days. Every head injury is different. Symptoms include: °· Mild to moderate headaches that will not go away. °· A feeling of pressure inside your head. °· Having more trouble than usual: °¨ Learning or remembering things you have heard. °¨ Answering questions. °¨ Paying attention or concentrating. °¨ Organizing daily tasks. °¨ Making decisions and solving problems. °· Slowness in thinking, acting or reacting, speaking, or reading. °· Getting lost or being easily confused. °· Feeling tired all the time or lacking energy (fatigued). °· Feeling drowsy. °· Sleep disturbances. °¨ Sleeping more  than usual. °¨ Sleeping less than usual. °¨ Trouble falling asleep. °¨ Trouble sleeping (insomnia). °· Loss of balance or feeling lightheaded or dizzy. °· Nausea or vomiting. °· Numbness or tingling. °· Increased sensitivity to: °¨ Sounds. °¨ Lights. °¨ Distractions. °· Vision problems or eyes that tire easily. °· Diminished sense of taste or smell. °· Ringing in the ears. °· Mood changes such as feeling sad or anxious. °· Becoming easily irritated or angry for little or no reason. °· Lack of motivation. °· Seeing or hearing things other people do not see or hear (hallucinations). °DIAGNOSIS °Your health care provider can usually diagnose a concussion based on a description of your injury and symptoms. He or she will ask whether you passed out (lost consciousness) and whether you are having trouble remembering events that happened right before and during your injury. °Your evaluation might include: °· A brain scan to look for signs of injury to the brain. Even if the test shows no injury, you may still have a concussion. °· Blood tests to be sure other problems are not present. °TREATMENT °· Concussions are usually treated in an emergency department, in urgent care, or at a clinic. You may need to stay in the hospital overnight for further treatment. °· Tell your health care provider if you are taking any medicines, including prescription medicines, over-the-counter medicines, and natural remedies. Some medicines, such as blood thinners (anticoagulants) and aspirin, may increase the chance of complications. Also tell your health care provider whether you have had alcohol or   or are taking illegal drugs. This information may affect treatment.  Your health care provider will send you home with important instructions to follow.  How fast you will recover from a concussion depends on many factors. These factors include how severe your concussion is, what part of your brain was injured, your age, and how healthy you  were before the concussion.  Most people with mild injuries recover fully. Recovery can take time. In general, recovery is slower in older persons. Also, persons who have had a concussion in the past or have other medical problems may find that it takes longer to recover from their current injury. HOME CARE INSTRUCTIONS General Instructions  Carefully follow the directions your health care provider gave you.  Only take over-the-counter or prescription medicines for pain, discomfort, or fever as directed by your health care provider.  Take only those medicines that your health care provider has approved.  Do not drink alcohol until your health care provider says you are well enough to do so. Alcohol and certain other drugs may slow your recovery and can put you at risk of further injury.  If it is harder than usual to remember things, write them down.  If you are easily distracted, try to do one thing at a time. For example, do not try to watch TV while fixing dinner.  Talk with family members or close friends when making important decisions.  Keep all follow-up appointments. Repeated evaluation of your symptoms is recommended for your recovery.  Watch your symptoms and tell others to do the same. Complications sometimes occur after a concussion. Older adults with a brain injury may have a higher risk of serious complications, such as a blood clot on the brain.  Tell your teachers, school nurse, school counselor, coach, athletic trainer, or work Production designer, theatre/television/filmmanager about your injury, symptoms, and restrictions. Tell them about what you can or cannot do. They should watch for:  Increased problems with attention or concentration.  Increased difficulty remembering or learning new information.  Increased time needed to complete tasks or assignments.  Increased irritability or decreased ability to cope with stress.  Increased symptoms.  Rest. Rest helps the brain to heal. Make sure you:  Get  plenty of sleep at night. Avoid staying up late at night.  Keep the same bedtime hours on weekends and weekdays.  Rest during the day. Take daytime naps or rest breaks when you feel tired.  Limit activities that require a lot of thought or concentration. These include:  Doing homework or job-related work.  Watching TV.  Working on the computer.  Avoid any situation where there is potential for another head injury (football, hockey, soccer, basketball, martial arts, downhill snow sports and horseback riding). Your condition will get worse every time you experience a concussion. You should avoid these activities until you are evaluated by the appropriate follow-up health care providers. Returning To Your Regular Activities You will need to return to your normal activities slowly, not all at once. You must give your body and brain enough time for recovery.  Do not return to sports or other athletic activities until your health care provider tells you it is safe to do so.  Ask your health care provider when you can drive, ride a bicycle, or operate heavy machinery. Your ability to react may be slower after a brain injury. Never do these activities if you are dizzy.  Ask your health care provider about when you can return to work or school. Preventing  Concussion °It is very important to avoid another brain injury, especially before you have recovered. In rare cases, another injury can lead to permanent brain damage, brain swelling, or death. The risk of this is greatest during the first 7-10 days after a head injury. Avoid injuries by: °· Wearing a seat belt when riding in a car. °· Drinking alcohol only in moderation. °· Wearing a helmet when biking, skiing, skateboarding, skating, or doing similar activities. °· Avoiding activities that could lead to a second concussion, such as contact or recreational sports, until your health care provider says it is okay. °· Taking safety measures in your  home. °¨ Remove clutter and tripping hazards from floors and stairways. °¨ Use grab bars in bathrooms and handrails by stairs. °¨ Place non-slip mats on floors and in bathtubs. °¨ Improve lighting in dim areas. °SEEK MEDICAL CARE IF: °· You have increased problems paying attention or concentrating. °· You have increased difficulty remembering or learning new information. °· You need more time to complete tasks or assignments than before. °· You have increased irritability or decreased ability to cope with stress. °· You have more symptoms than before. °Seek medical care if you have any of the following symptoms for more than 2 weeks after your injury: °· Lasting (chronic) headaches. °· Dizziness or balance problems. °· Nausea. °· Vision problems. °· Increased sensitivity to noise or light. °· Depression or mood swings. °· Anxiety or irritability. °· Memory problems. °· Difficulty concentrating or paying attention. °· Sleep problems. °· Feeling tired all the time. °SEEK IMMEDIATE MEDICAL CARE IF: °· You have severe or worsening headaches. These may be a sign of a blood clot in the brain. °· You have weakness (even if only in one hand, leg, or part of the face). °· You have numbness. °· You have decreased coordination. °· You vomit repeatedly. °· You have increased sleepiness. °· One pupil is larger than the other. °· You have convulsions. °· You have slurred speech. °· You have increased confusion. This may be a sign of a blood clot in the brain. °· You have increased restlessness, agitation, or irritability. °· You are unable to recognize people or places. °· You have neck pain. °· It is difficult to wake you up. °· You have unusual behavior changes. °· You lose consciousness. °MAKE SURE YOU: °· Understand these instructions. °· Will watch your condition. °· Will get help right away if you are not doing well or get worse. °  °This information is not intended to replace advice given to you by your health care  provider. Make sure you discuss any questions you have with your health care provider. °  °Document Released: 12/12/2003 Document Revised: 10/12/2014 Document Reviewed: 04/13/2013 °[employer] Interactive Patient Education ©2016 [employer] ° °

## 2016-02-21 NOTE — ED Provider Notes (Signed)
CSN: [id number]     Arrival date & time 02/21/16  [id number] History   First MD Initiated Contact with Patient 02/21/16 0840     Chief Complaint  Patient presents with  . hit in head   . headaches   . Nausea    HPI  Patient presents with concern of headache, nausea. Patient has a history of migraine headaches. 3 days ago the patient was struck in the left front of her head with a softball. There is no loss of consciousness, but there was the sudden onset of pain, dizziness, flashing light. Since that time she has had pain persistently, in a manner inconsistent with prior migraine headache, as well as nausea, new/worse today. No new asymmetric weakness, no new vomiting but no new vision changes that are persistent. No relief with ibuprofen. Patient has specific concern of possible intracranial hemorrhage.   Past Medical History  Diagnosis Date  . Active labor 04/09/2012  . Grand multipara in labor 04/09/2012  . Postpartum care following vaginal delivery (6/6, F) 04/09/2012   Past Surgical History  Procedure Laterality Date  . Ovarian cyst surgery    . Dilation and curettage of uterus    . Tubal ligation  04/10/2012    Procedure: POST PARTUM TUBAL LIGATION;  Surgeon: Robley FriesVaishali R Mody, MD;  Location: [facility];  Service: Gynecology;  Laterality: Bilateral;  Bilateral post partum tubal ligation   Family History  Problem Relation Age of Onset  . Heart disease Mother   . Hypertension Mother    Social History  Substance Use Topics  . Smoking status: Never Smoker   . Smokeless tobacco: None  . Alcohol Use: No   OB History    Gravida Para Term Preterm AB TAB SAB Ectopic Multiple Living   9 6 6  3  3   6      Review of Systems  Constitutional:       Per HPI, otherwise negative  HENT:       Per HPI, otherwise negative  Respiratory:       Per HPI, otherwise negative  Cardiovascular:       Per HPI, otherwise negative  Gastrointestinal: Positive for nausea. Negative for vomiting.    Endocrine:       Negative aside from HPI  Genitourinary:       Neg aside from HPI   Musculoskeletal:       Per HPI, otherwise negative  Skin: Positive for wound.  Neurological: Positive for dizziness, light-headedness and headaches. Negative for syncope.      Allergies  Penicillins and Shrimp  Home Medications   Prior to Admission medications   Medication Sig Start Date End Date Taking? Authorizing Provider  diphenhydrAMINE (BENADRYL) 25 MG tablet Take 25 mg by mouth every 6 (six) hours as needed. For cold symptoms    Historical Provider, MD  ibuprofen (ADVIL,MOTRIN) 800 MG tablet Take 1 tablet (800 mg total) by mouth 3 (three) times daily. 10/25/14   Elpidio AnisShari Upstill, PA-C  Multiple Vitamin (MULTIVITAMIN) capsule Take 1 capsule by mouth daily.    Historical Provider, MD   BP 130/78 mmHg  Pulse 62  Temp(Src) 97.7 F (36.5 C) (Oral)  Resp 18  SpO2 100%  LMP 02/04/2016 Physical Exam  Constitutional: She is oriented to person, place, and time. She appears well-developed and well-nourished. No distress.  HENT:  Head: Normocephalic and atraumatic.    Eyes: Conjunctivae and EOM are normal.  Cardiovascular: Normal rate and regular rhythm.   Pulmonary/Chest: Effort  normal and breath sounds normal. No stridor. No respiratory distress.  Abdominal: She exhibits no distension.  Musculoskeletal: She exhibits no edema.  Neurological: She is alert and oriented to person, place, and time. She displays no atrophy and no tremor. No cranial nerve deficit. She exhibits normal muscle tone. She displays no seizure activity. Coordination normal.  Skin: Skin is warm and dry.  Psychiatric: She has a normal mood and affect.  Nursing note and vitals reviewed.   ED Course  Procedures (including critical care time)  I reviewed the imaging study, agree with the interpretation. I discussed with the patient on repeat exam, and she had no new complaints, was in no distress. We discussed return  precautions, follow-up instructions.  MDM  Patient presents with concern of headache following trauma. Patient does have a history of migraines, but with her atypical pain, nausea, and concern for possible intracranial hemorrhage, CT scan was performed. This was reassuring, patient had reduction of pain, and was discharged in stable condition.  Gerhard Munch, MD 02/21/16 1041

## 2018-01-08 IMAGING — CT CT HEAD W/O CM
2 series · 17 of 30 positions shown, 20 images · non-contrast
Comparison: None.

CLINICAL DATA: Pain and nausea after recent trauma

EXAM:
CT HEAD WITHOUT CONTRAST
TECHNIQUE: Contiguous axial images were obtained from the base of the skull
through the vertex without intravenous contrast.

[Series 2: head w/o · axial · non-contrast · 0.45mm/px · z∈[-89,+31]mm · 9 of 30 slices shown, 12 images]
[im 3/30  brain]
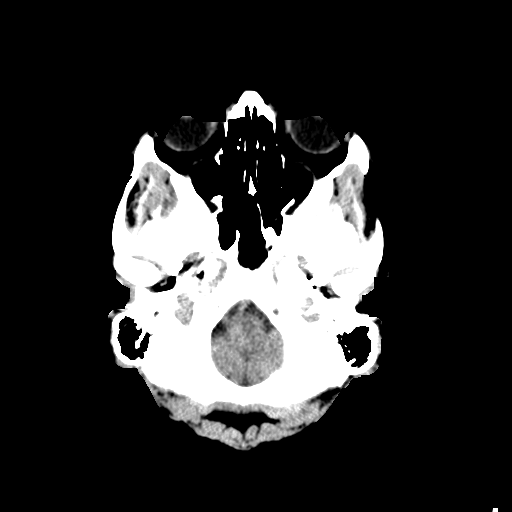
[im 3/30  bone]
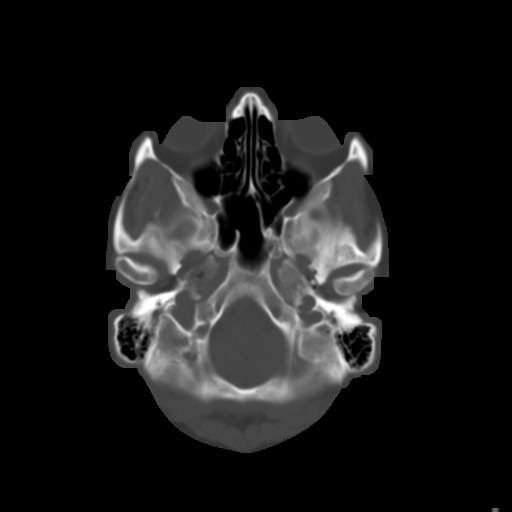
[im 6/30  brain]
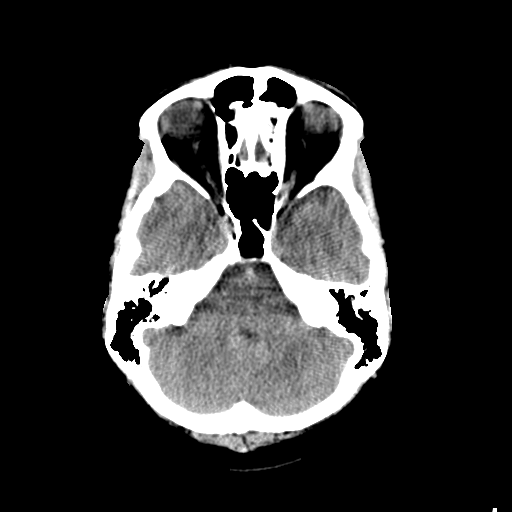
[im 9/30  brain]
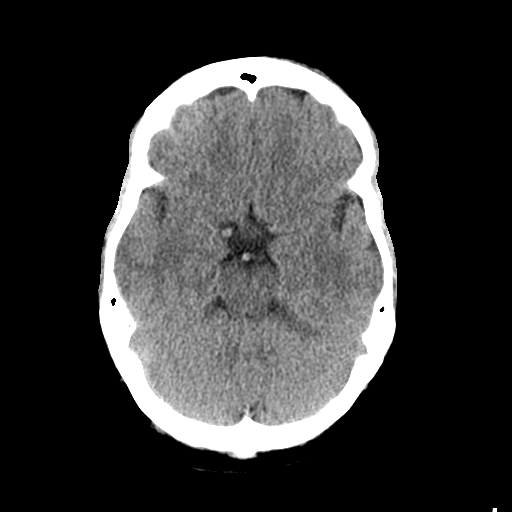
[im 12/30  brain]
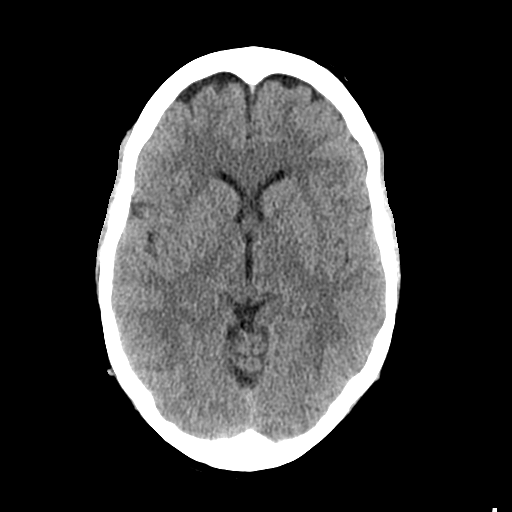
[im 15/30  brain]
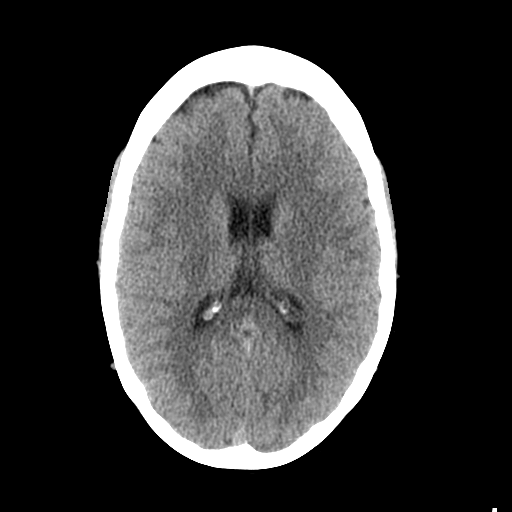
[im 15/30  bone]
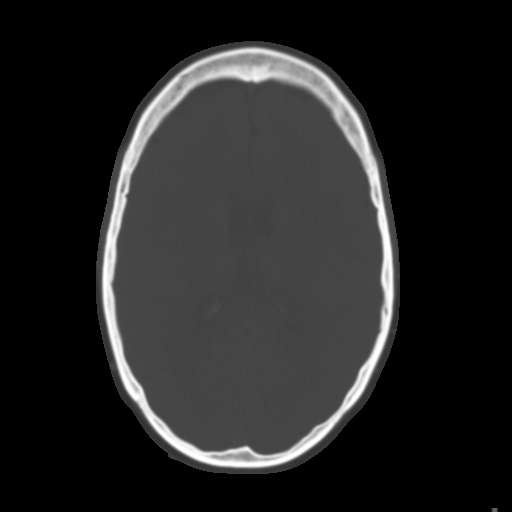
[im 18/30  brain]
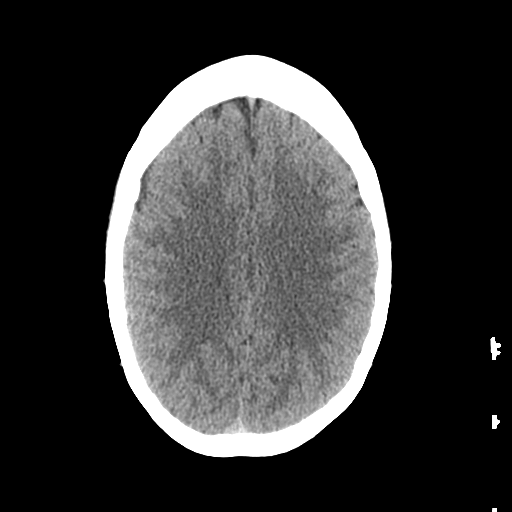
[im 21/30  brain]
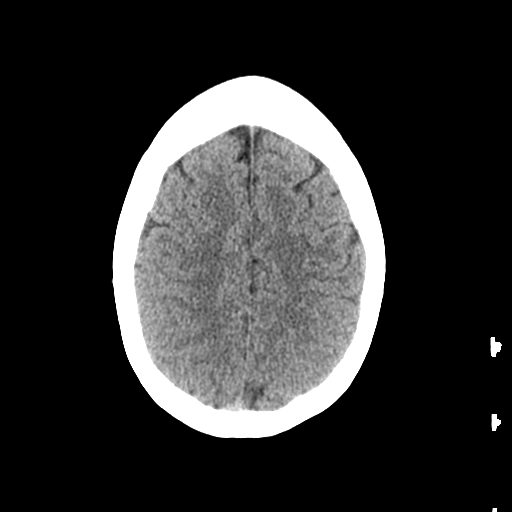
[im 24/30  brain]
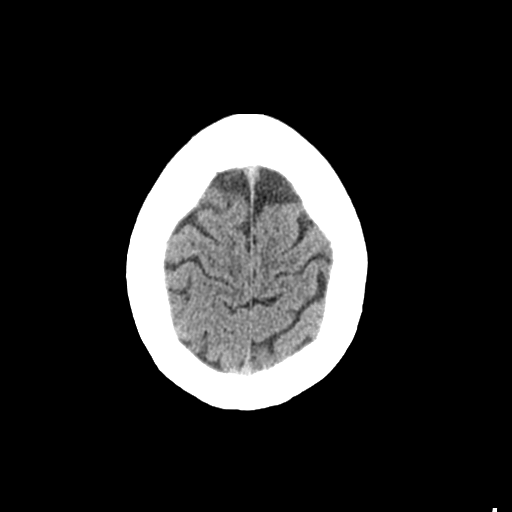
[im 27/30  brain]
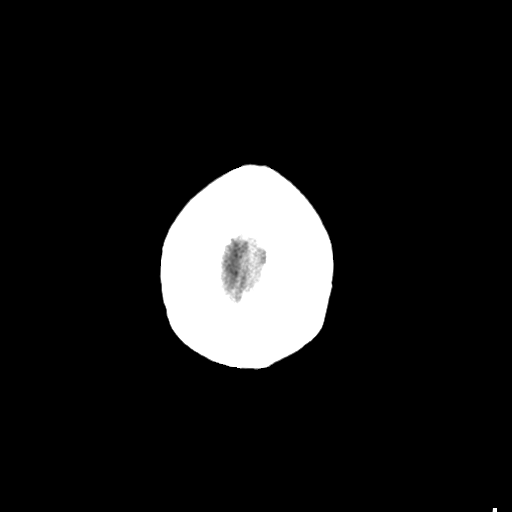
[im 27/30  bone]
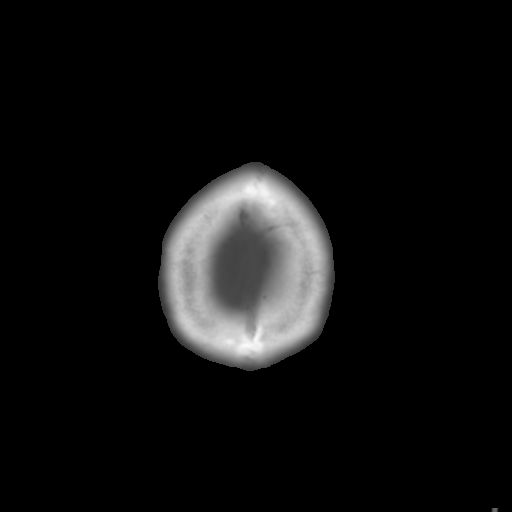

[Series 3: bone windows · axial · 0.45mm/px · z∈[-84,+30]mm · 8 of 50 slices shown]
[im 6/50  bone]
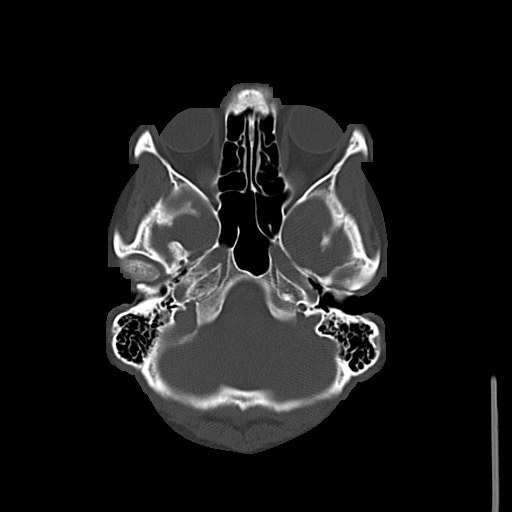
[im 11/50  bone]
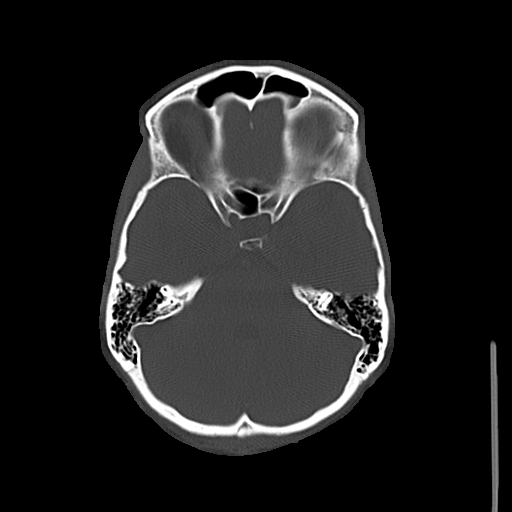
[im 17/50  bone]
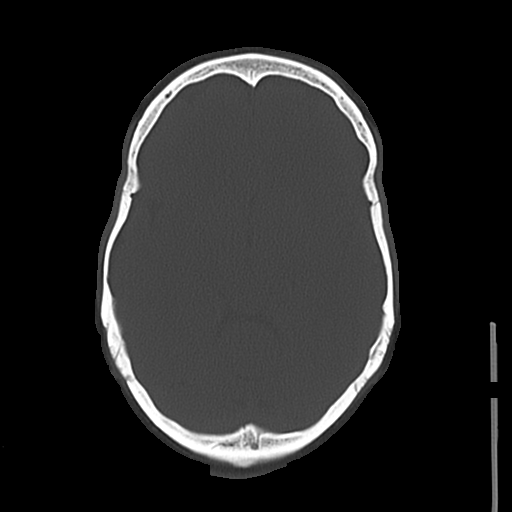
[im 22/50  bone]
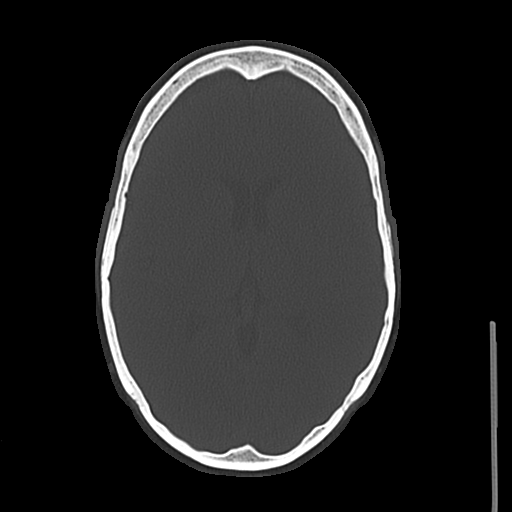
[im 28/50  bone]
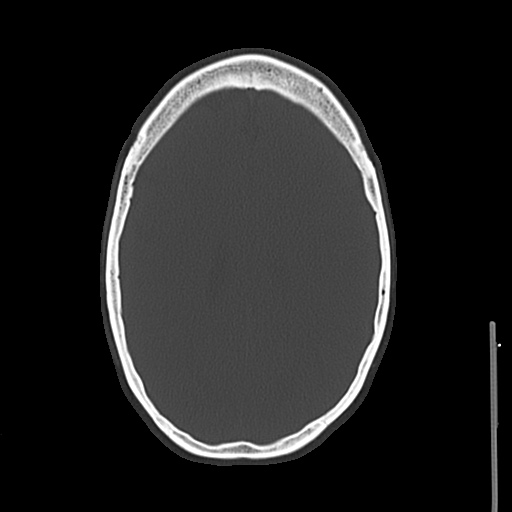
[im 33/50  bone]
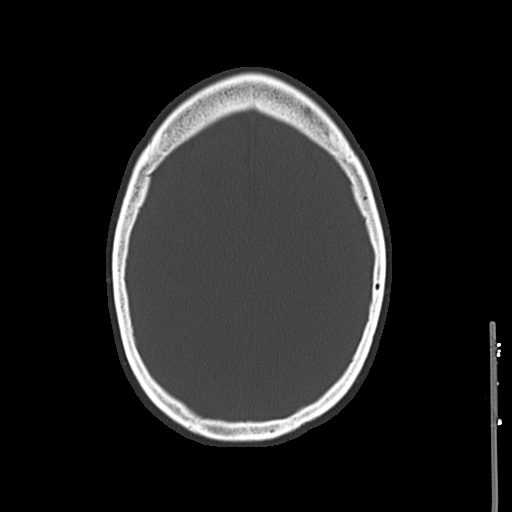
[im 39/50  bone]
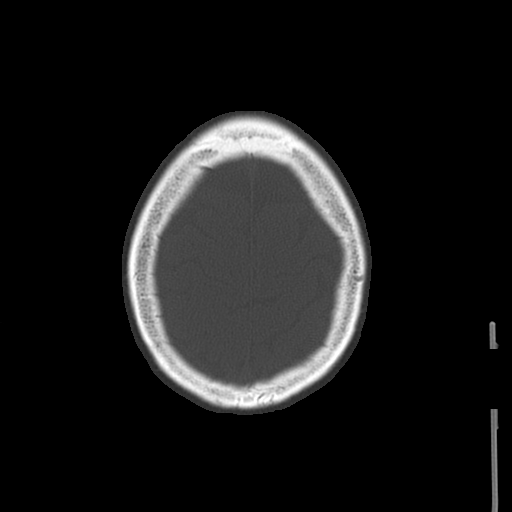
[im 44/50  bone]
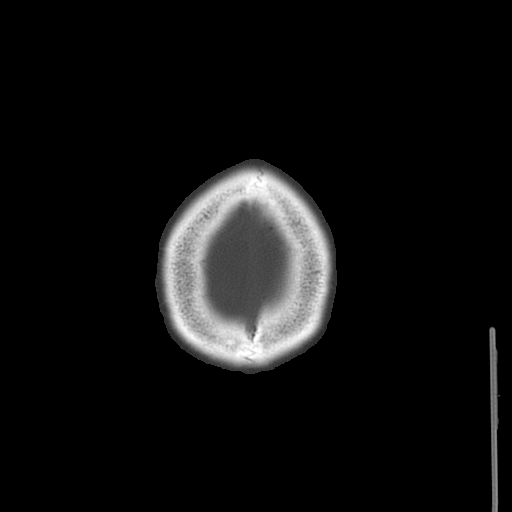

[17 of 30 positions shown; findings below may reference images not displayed]

FINDINGS: The ventricles are normal in size and configuration. There is no
intracranial mass, hemorrhage, extra-axial fluid collection, or
midline shift. The gray-white compartments are normal. No acute
infarct evident. The bony calvarium appears intact. The mastoid air
cells are clear. No intraorbital lesions are evident.
IMPRESSION: Study within normal limits.
